# Patient Record
Sex: Male | Born: 2000 | Race: Black or African American | Hispanic: No | Marital: Single | Smoking: Never smoker
Health system: Southern US, Community
[De-identification: ages and names within clinical notes are randomized; demographics above are authoritative.]

## PROBLEM LIST (undated history)

## (undated) DIAGNOSIS — M549 Dorsalgia, unspecified: Secondary | ICD-10-CM

## (undated) DIAGNOSIS — J45909 Unspecified asthma, uncomplicated: Secondary | ICD-10-CM

## (undated) DIAGNOSIS — L509 Urticaria, unspecified: Secondary | ICD-10-CM

---
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
## 2023-03-02 ENCOUNTER — Emergency Department (HOSPITAL_BASED_OUTPATIENT_CLINIC_OR_DEPARTMENT_OTHER)
Admission: EM | Admit: 2023-03-02 | Discharge: 2023-03-02 | Disposition: A | Discharge status: Home / Self Care | Payer: Medicaid Other | Attending: Emergency Medicine | Admitting: Emergency Medicine

## 2023-03-02 ENCOUNTER — Emergency Department (HOSPITAL_BASED_OUTPATIENT_CLINIC_OR_DEPARTMENT_OTHER): Payer: Medicaid Other

## 2023-03-02 ENCOUNTER — Encounter (HOSPITAL_BASED_OUTPATIENT_CLINIC_OR_DEPARTMENT_OTHER): Payer: Self-pay

## 2023-03-02 ENCOUNTER — Other Ambulatory Visit: Payer: Self-pay

## 2023-03-02 DIAGNOSIS — R0789 Other chest pain: Secondary | ICD-10-CM | POA: Diagnosis not present

## 2023-03-02 DIAGNOSIS — R0781 Pleurodynia: Secondary | ICD-10-CM | POA: Diagnosis present

## 2023-03-02 DIAGNOSIS — J45909 Unspecified asthma, uncomplicated: Secondary | ICD-10-CM | POA: Diagnosis not present

## 2023-03-02 HISTORY — DX: Urticaria, unspecified: L50.9

## 2023-03-02 HISTORY — DX: Dorsalgia, unspecified: M54.9

## 2023-03-02 LAB — CBC
HCT: 41 % (ref 39.0–52.0)
Hemoglobin: 13.8 g/dL (ref 13.0–17.0)
MCH: 31.4 pg (ref 26.0–34.0)
MCHC: 33.7 g/dL (ref 30.0–36.0)
MCV: 93.4 fL (ref 80.0–100.0)
Platelets: 199 10*3/uL (ref 150–400)
RBC: 4.39 MIL/uL (ref 4.22–5.81)
RDW: 12.2 % (ref 11.5–15.5)
WBC: 5.8 10*3/uL (ref 4.0–10.5)
nRBC: 0 % (ref 0.0–0.2)

## 2023-03-02 LAB — BASIC METABOLIC PANEL
Anion gap: 9 (ref 5–15)
BUN: 11 mg/dL (ref 6–20)
CO2: 26 mmol/L (ref 22–32)
Calcium: 9.3 mg/dL (ref 8.9–10.3)
Chloride: 102 mmol/L (ref 98–111)
Creatinine, Ser: 0.86 mg/dL (ref 0.61–1.24)
GFR, Estimated: >60 mL/min (ref >60)
Glucose, Bld: 79 mg/dL (ref 70–99)
Potassium: 3.8 mmol/L (ref 3.5–5.1)
Sodium: 137 mmol/L (ref 135–145)

## 2023-03-02 LAB — TROPONIN I (HIGH SENSITIVITY): Troponin I (High Sensitivity): <2 ng/L (ref <18)

## 2023-03-02 MED ORDER — IBUPROFEN 400 MG PO TABS
600.0000 mg | ORAL_TABLET | Freq: Once | ORAL | Status: AC
  Administered 2023-03-02: 600 mg via ORAL
  Filled 2023-03-02: qty 1

## 2023-03-02 NOTE — ED Triage Notes (Signed)
Pt reports chest pain worse with inspiration, described as aching, tightness "almost like it is squeezing my heart" onset a week ago and is gradually getting worse. Hx of asthma but states this does not feel like it. Shortness of breath only with the pain. He reports he has been under a lot of stress right now.
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
## 2023-03-02 NOTE — Discharge Instructions (Signed)
You were evaluated today for chest wall pain.  Your workup was reassuring for no signs of acute coronary syndrome or pulmonary embolism.  Please take over-the-counter ibuprofen for symptoms as needed.

## 2023-08-09 ENCOUNTER — Encounter (HOSPITAL_BASED_OUTPATIENT_CLINIC_OR_DEPARTMENT_OTHER): Payer: Self-pay | Admitting: Emergency Medicine

## 2023-08-09 ENCOUNTER — Other Ambulatory Visit: Payer: Self-pay

## 2023-08-09 ENCOUNTER — Emergency Department (HOSPITAL_BASED_OUTPATIENT_CLINIC_OR_DEPARTMENT_OTHER)
Admission: EM | Admit: 2023-08-09 | Discharge: 2023-08-09 | Disposition: A | Discharge status: Home / Self Care | Payer: Medicaid Other | Attending: Emergency Medicine | Admitting: Emergency Medicine

## 2023-08-09 DIAGNOSIS — L290 Pruritus ani: Secondary | ICD-10-CM

## 2023-08-09 DIAGNOSIS — R21 Rash and other nonspecific skin eruption: Secondary | ICD-10-CM | POA: Insufficient documentation

## 2023-08-09 LAB — WET PREP, GENITAL
Clue Cells Wet Prep HPF POC: NONE SEEN
Sperm: NONE SEEN
Trich, Wet Prep: NONE SEEN
WBC, Wet Prep HPF POC: <10 (ref <10)
Yeast Wet Prep HPF POC: NONE SEEN

## 2023-08-09 LAB — URINALYSIS, ROUTINE W REFLEX MICROSCOPIC
Bilirubin Urine: NEGATIVE
Glucose, UA: NEGATIVE mg/dL
Hgb urine dipstick: NEGATIVE
Ketones, ur: NEGATIVE mg/dL
Leukocytes,Ua: NEGATIVE
Nitrite: NEGATIVE
Protein, ur: NEGATIVE mg/dL
Specific Gravity, Urine: >=1.03 (ref 1.005–1.030)
pH: 6.5 (ref 5.0–8.0)

## 2023-08-09 LAB — HIV ANTIBODY (ROUTINE TESTING W REFLEX): HIV Screen 4th Generation wRfx: NONREACTIVE

## 2023-08-09 MED ORDER — VALACYCLOVIR HCL 1 G PO TABS: 1000.0000 mg | ORAL_TABLET | Freq: Every day | ORAL | 0 refills | Status: AC

## 2023-08-09 MED ORDER — CETIRIZINE HCL 10 MG PO TABS: 10.0000 mg | ORAL_TABLET | Freq: Every day | ORAL | 0 refills | Status: AC | PRN

## 2023-08-09 NOTE — ED Triage Notes (Signed)
Rectal itching and mild rectal pain x 2 weeks. Denies discharge. Unknown exposure to stds. Requesting testing. Denies penetration/trauma to rectum. Denies rectal bleeding.   Endorses "bumps" on penis that are gradually spreading. Denies urinary sx. Denies penile discharge.

## 2023-08-09 NOTE — ED Provider Notes (Signed)
[facility] Provider Note   CSN: [id number] Arrival date & time: 08/09/23  1047     History  Chief Complaint  Patient presents with   Rectal Problems    Adrian Adams is a 22 y.o. male.  HPI   22 year old male presents emergency department with complaints of anal itching as well as rash on penis and scrotum.  Patient states he has had intermittent anal itching for the past 2 weeks or so.  Reports history of similar symptoms several years ago when he had STD.  Denies any receptive anal intercourse.  States that itching is not necessarily worse at night early in the morning.  States that it is only worse when he has a bowel movement and when he moves certain ways.  States that he had a recent dietary change to include mainly fruits and veggies.  Denies any diarrhea recently.  Patient also states over the last week or so, has noted rash on both the shaft of his penis as well as the scrotum.  Patient notes similar rash again several years ago that resolved spontaneously without being seen after about a month's time.  Describes rash as itching and slightly painful.  States recent oral sex but without penetrative receptive anal sex.  Past medical history significant for asthma  Home Medications Prior to Admission medications   Medication Sig Start Date End Date Taking? Authorizing Provider  cetirizine (ZYRTEC ALLERGY) 10 MG tablet Take 1 tablet (10 mg total) by mouth daily as needed for allergies (itch). 08/09/23  Yes Sherian Maroon A, PA  valACYclovir (VALTREX) 1000 MG tablet Take 1 tablet (1,000 mg total) by mouth daily. 08/09/23  Yes Sherian Maroon A, PA  amoxicillin (AMOXIL) 500 MG capsule Take 1 capsule (500 mg total) by mouth 3 (three) times daily. 09/14/16   Rumley, Lora Havens, DO  cetirizine (ZYRTEC) 10 MG tablet Take 10 mg by mouth daily.    [provider]  IBUPROFEN PO Take by mouth.    [provider]   meloxicam (MOBIC) 15 MG tablet Take 15 mg by mouth daily.    [provider]      Allergies    Acetaminophen and Bee pollen    Review of Systems   Review of Systems  All other systems reviewed and are negative.   Physical Exam Updated Vital Signs BP 118/65 (BP Location: Left Arm)   Pulse (!) 52   Temp 97.9 F (36.6 C) (Oral)   Resp 16   Ht 5\' 9"  (1.753 m)   Wt 59 kg   SpO2 100%   BMI 19.20 kg/m  Physical Exam Vitals and nursing note reviewed. Exam conducted with a chaperone present.  Constitutional:      General: He is not in acute distress.    Appearance: He is well-developed.  HENT:     Head: Normocephalic and atraumatic.  Eyes:     Conjunctiva/sclera: Conjunctivae normal.  Cardiovascular:     Rate and Rhythm: Normal rate and regular rhythm.     Heart sounds: No murmur heard. Pulmonary:     Effort: Pulmonary effort is normal. No respiratory distress.     Breath sounds: Normal breath sounds.  Abdominal:     Palpations: Abdomen is soft.     Tenderness: There is no abdominal tenderness.  Genitourinary:    Penis: Circumcised.      Testes: Normal. Cremasteric reflex is present.     Epididymis:     Right:  Normal.     Left: Normal.     Rectum: Normal. Guaiac result negative. No mass, tenderness, external hemorrhoid or internal hemorrhoid. Normal anal tone.     Comments: GU exam performed with performed with male nurse staff at bedside.  Patient with numerous papulovesicular lesions on the shaft of penis as well as diffusely on scrotum.  No surrounding erythema/swelling.  Area is not umbilicated.  No obvious appreciable rash externally on rectal exam.  No obvious erythema, palpable fluctuance/induration.  No palpable crepitus.  Adequate sphincter tone.  No tenderness on rectal exam.  No palpable mass/fissure.  Brown stool obtained without evidence of medic easier/melena. Musculoskeletal:        General: No swelling.     Cervical back: Neck supple.  Skin:     General: Skin is warm and dry.     Capillary Refill: Capillary refill takes less than 2 seconds.  Neurological:     Mental Status: He is alert.  Psychiatric:        Mood and Affect: Mood normal.     ED Results / Procedures / Treatments   Labs (all labs ordered are listed, but only abnormal results are displayed) Labs Reviewed  WET PREP, GENITAL  URINALYSIS, ROUTINE W REFLEX MICROSCOPIC  RPR  HIV ANTIBODY (ROUTINE TESTING W REFLEX)  GC/CHLAMYDIA PROBE AMP ([place]) NOT AT [facility]    EKG None  Radiology No results found.  Procedures Procedures    Medications Ordered in ED Medications - No data to display  ED Course/ Medical Decision Making/ A&P                                 Medical Decision Making  This patient presents to the ED for concern of anal itching, rash, this involves an extensive number of treatment options, and is a complaint that carries with it a high risk of complications and morbidity.  The differential diagnosis includes HPV, molluscum contagiosum, HSV, HIV, syphilis, gonorrhea/chlamydia, Fournier's gangrene, hemorrhoid, pinworm, other   Co morbidities that complicate the patient evaluation  See HPI   Additional history obtained:  Additional history obtained from EMR External records from outside source obtained and reviewed including hospital records   Lab Tests:  I Ordered, and personally interpreted labs.  The pertinent results include: UA negative.  Wet prep negative.  HIV, RPR, GC/committee pending   Imaging Studies ordered:  N/a   Cardiac Monitoring: / EKG:  The patient was maintained on a cardiac monitor.  I personally viewed and interpreted the cardiac monitored which showed an underlying rhythm of: Sinus rhythm   Consultations Obtained:  N/a   Problem List / ED Course / Critical interventions / Medication management  Rash, anal itching Reevaluation of the patient showed that the patient stayed the same I have  reviewed the patients home medicines and have made adjustments as needed   Social Determinants of Health:  Denies tobacco, illicit drug use   Test / Admission - Considered:  Rash, anal itching Vitals signs within normal range and stable throughout visit. Laboratory studies significant for: See above 22 year old male presents emergency department with complaints of a couple weeks worth of anal itching as well as penile rash.  Regarding anal itching, no evidence of external hemorrhoid, internal hemorrhoid, fissure.  No obvious appreciable rash.  No clinical evidence of Fournier gangrene.  Patient was with recent change in diet to include more acidic foods but has  been without diarrhea.  Patient symptoms not necessarily worsened at night or early in the morning; low suspicion for pinworms.  Was given a trial of hydrocortisone suppository by prior provider 3 to 4 days ago but patient has not tried said medication in the outpatient setting.  Will recommend sitz bath and trial of hydrocortisone suppository and follow-up with primary care for further assessment.  Patient declined any ova/parasite testing at this time.  Regarding penile rash, patient with papular vesicular rash on penis.  No clinical evidence of cellulitis.  STD testing pending.  Given recent oral sexual intercourse prior to rash development, some concern for HSV.  Will treat with antivirals and have patient follow-up with primary care for reassessment.  Treatment plan discussed at length with patient and he acknowledged understanding was agreeable to said plan.  Patient overall well-appearing, afebrile in no acute distress. Worrisome signs and symptoms were discussed with the patient, and the patient acknowledged understanding to return to the ED if noticed. Patient was stable upon discharge.          Final Clinical Impression(s) / ED Diagnoses Final diagnoses:  Rash  Anal itch    Rx / DC Orders ED Discharge Orders           Ordered    valACYclovir (VALTREX) 1000 MG tablet  Daily        08/09/23 1237    cetirizine (ZYRTEC ALLERGY) 10 MG tablet  Daily PRN        08/09/23 1237              Peter Garter, [place] 08/09/23 1244    Rolan Bucco, MD 08/09/23 1428

## 2023-08-09 NOTE — Discharge Instructions (Addendum)
As discussed, urine did not look infected.  You tested negative for trichomonas, BV, yeast.  Will treat given concern for genital herpes as well as follow-up testing for HIV, syphilis, gonorrhea/chlamydia on your MyChart app.  Recommend close follow-up with primary care in the outpatient setting for reassessment.  Regarding anal itching, will recommend use of hydrocortisone suppository prescribed by prior provider as I think you will notice improvement from said medication.  Call number attached to discharge papers to schedule appointment with your primary care provider.  Please do not hesitate to return to emergency department for worrisome signs and symptoms we discussed become apparent.

## 2023-08-09 NOTE — ED Notes (Signed)
ED Provider at bedside. 

## 2023-08-10 LAB — RPR: RPR Ser Ql: NONREACTIVE

## 2023-08-11 LAB — GC/CHLAMYDIA PROBE AMP (~~LOC~~) NOT AT ARMC
Chlamydia: NEGATIVE
Comment: NEGATIVE
Comment: NORMAL
Neisseria Gonorrhea: NEGATIVE
